# Patient Record
Sex: Male | Born: 1960 | Race: White | Hispanic: No | Marital: Married | Smoking: Never smoker
Health system: Southern US, Community
[De-identification: ages and names within clinical notes are randomized; demographics above are authoritative.]

## PROBLEM LIST (undated history)

## (undated) DIAGNOSIS — R195 Other fecal abnormalities: Secondary | ICD-10-CM

## (undated) DIAGNOSIS — K219 Gastro-esophageal reflux disease without esophagitis: Secondary | ICD-10-CM

## (undated) DIAGNOSIS — J45909 Unspecified asthma, uncomplicated: Secondary | ICD-10-CM

## (undated) DIAGNOSIS — H9193 Unspecified hearing loss, bilateral: Secondary | ICD-10-CM

## (undated) DIAGNOSIS — E7800 Pure hypercholesterolemia, unspecified: Secondary | ICD-10-CM

## (undated) DIAGNOSIS — R002 Palpitations: Secondary | ICD-10-CM

## (undated) HISTORY — DX: Other fecal abnormalities: R19.5

## (undated) HISTORY — DX: Unspecified hearing loss, bilateral: H91.93

## (undated) HISTORY — PX: HERNIA REPAIR: SHX51

## (undated) HISTORY — DX: Palpitations: R00.2

## (undated) HISTORY — DX: Gastro-esophageal reflux disease without esophagitis: K21.9

## (undated) HISTORY — DX: Pure hypercholesterolemia, unspecified: E78.00

## (undated) HISTORY — PX: COLONOSCOPY: SHX174

---

## 2018-08-19 ENCOUNTER — Ambulatory Visit (HOSPITAL_COMMUNITY)
Admission: RE | Admit: 2018-08-19 | Discharge: 2018-08-19 | Disposition: A | Discharge status: Home / Self Care | Payer: BC Managed Care – PPO | Source: Other Acute Inpatient Hospital | Attending: Orthopedic Surgery | Admitting: Orthopedic Surgery

## 2018-08-19 ENCOUNTER — Ambulatory Visit (HOSPITAL_COMMUNITY): Payer: BC Managed Care – PPO | Admitting: Certified Registered Nurse Anesthetist

## 2018-08-19 ENCOUNTER — Emergency Department (HOSPITAL_COMMUNITY): Payer: BC Managed Care – PPO

## 2018-08-19 ENCOUNTER — Encounter (HOSPITAL_COMMUNITY): Payer: Self-pay | Admitting: Obstetrics and Gynecology

## 2018-08-19 ENCOUNTER — Other Ambulatory Visit: Payer: Self-pay

## 2018-08-19 ENCOUNTER — Encounter (HOSPITAL_COMMUNITY): Payer: Self-pay

## 2018-08-19 ENCOUNTER — Emergency Department (HOSPITAL_COMMUNITY)
Admission: EM | Admit: 2018-08-19 | Discharge: 2018-08-19 | Disposition: A | Discharge status: Home / Self Care | Payer: BC Managed Care – PPO | Source: Home / Self Care | Attending: Emergency Medicine | Admitting: Emergency Medicine

## 2018-08-19 ENCOUNTER — Encounter (HOSPITAL_COMMUNITY): Admission: RE | Disposition: A | Discharge status: Home / Self Care | Payer: Self-pay | Attending: Orthopedic Surgery

## 2018-08-19 DIAGNOSIS — S61215A Laceration without foreign body of left ring finger without damage to nail, initial encounter: Secondary | ICD-10-CM | POA: Insufficient documentation

## 2018-08-19 DIAGNOSIS — Z1159 Encounter for screening for other viral diseases: Secondary | ICD-10-CM | POA: Insufficient documentation

## 2018-08-19 DIAGNOSIS — S6992XA Unspecified injury of left wrist, hand and finger(s), initial encounter: Secondary | ICD-10-CM

## 2018-08-19 DIAGNOSIS — S65515A Laceration of blood vessel of left ring finger, initial encounter: Secondary | ICD-10-CM

## 2018-08-19 DIAGNOSIS — W230XXA Caught, crushed, jammed, or pinched between moving objects, initial encounter: Secondary | ICD-10-CM | POA: Insufficient documentation

## 2018-08-19 HISTORY — PX: I & D EXTREMITY: SHX5045

## 2018-08-19 HISTORY — DX: Unspecified asthma, uncomplicated: J45.909

## 2018-08-19 LAB — SURGICAL PCR SCREEN
MRSA, PCR: NEGATIVE
Staphylococcus aureus: POSITIVE — AB

## 2018-08-19 LAB — SARS CORONAVIRUS 2 BY RT PCR (HOSPITAL ORDER, PERFORMED IN ~~LOC~~ HOSPITAL LAB): SARS Coronavirus 2: NEGATIVE

## 2018-08-19 SURGERY — IRRIGATION AND DEBRIDEMENT EXTREMITY
Anesthesia: General | Site: Hand | Laterality: Left

## 2018-08-19 MED ORDER — CEFAZOLIN SODIUM-DEXTROSE 2-4 GM/100ML-% IV SOLN
2.0000 g | INTRAVENOUS | Status: AC
  Administered 2018-08-19: 16:00:00 2 g via INTRAVENOUS
  Filled 2018-08-19: qty 100

## 2018-08-19 MED ORDER — FENTANYL CITRATE (PF) 250 MCG/5ML IJ SOLN
INTRAMUSCULAR | Status: AC
  Filled 2018-08-19: qty 5

## 2018-08-19 MED ORDER — POVIDONE-IODINE 10 % EX SWAB: 2.0000 "application " | Freq: Once | CUTANEOUS | Status: DC

## 2018-08-19 MED ORDER — MIDAZOLAM HCL 5 MG/5ML IJ SOLN
INTRAMUSCULAR | Status: DC | PRN
  Administered 2018-08-19: 2 mg via INTRAVENOUS

## 2018-08-19 MED ORDER — BUPIVACAINE HCL (PF) 0.5 % IJ SOLN
10.0000 mL | Freq: Once | INTRAMUSCULAR | Status: DC
  Filled 2018-08-19: qty 30

## 2018-08-19 MED ORDER — OXYCODONE HCL 5 MG/5ML PO SOLN: 5.0000 mg | Freq: Once | ORAL | Status: DC | PRN

## 2018-08-19 MED ORDER — BUPIVACAINE HCL (PF) 0.25 % IJ SOLN
INTRAMUSCULAR | Status: AC
  Filled 2018-08-19: qty 30

## 2018-08-19 MED ORDER — ONDANSETRON HCL 4 MG/2ML IJ SOLN
INTRAMUSCULAR | Status: DC | PRN
  Administered 2018-08-19: 4 mg via INTRAVENOUS

## 2018-08-19 MED ORDER — TETANUS-DIPHTH-ACELL PERTUSSIS 5-2.5-18.5 LF-MCG/0.5 IM SUSP
0.5000 mL | Freq: Once | INTRAMUSCULAR | Status: AC
  Administered 2018-08-19: 0.5 mL via INTRAMUSCULAR
  Filled 2018-08-19: qty 0.5

## 2018-08-19 MED ORDER — CHLORHEXIDINE GLUCONATE 4 % EX LIQD: 60.0000 mL | Freq: Once | CUTANEOUS | Status: DC

## 2018-08-19 MED ORDER — PROPOFOL 10 MG/ML IV BOLUS
INTRAVENOUS | Status: DC | PRN
  Administered 2018-08-19: 190 mg via INTRAVENOUS

## 2018-08-19 MED ORDER — FENTANYL CITRATE (PF) 100 MCG/2ML IJ SOLN: 25.0000 ug | INTRAMUSCULAR | Status: DC | PRN

## 2018-08-19 MED ORDER — MIDAZOLAM HCL 2 MG/2ML IJ SOLN
INTRAMUSCULAR | Status: AC
  Filled 2018-08-19: qty 2

## 2018-08-19 MED ORDER — LIDOCAINE HCL 1 % IJ SOLN
INTRAMUSCULAR | Status: AC
  Administered 2018-08-19: 20 mL
  Filled 2018-08-19: qty 20

## 2018-08-19 MED ORDER — OXYCODONE HCL 5 MG PO TABS: 5.0000 mg | ORAL_TABLET | Freq: Once | ORAL | Status: DC | PRN

## 2018-08-19 MED ORDER — SODIUM CHLORIDE 0.9 % IR SOLN
Status: DC | PRN
  Administered 2018-08-19: 1000 mL

## 2018-08-19 MED ORDER — PROPOFOL 10 MG/ML IV BOLUS
INTRAVENOUS | Status: AC
  Filled 2018-08-19: qty 20

## 2018-08-19 MED ORDER — FENTANYL CITRATE (PF) 100 MCG/2ML IJ SOLN
INTRAMUSCULAR | Status: DC | PRN
  Administered 2018-08-19 (×2): 50 ug via INTRAVENOUS

## 2018-08-19 MED ORDER — LACTATED RINGERS IV SOLN
INTRAVENOUS | Status: DC
  Administered 2018-08-19 (×2): via INTRAVENOUS

## 2018-08-19 MED ORDER — EPHEDRINE 5 MG/ML INJ
INTRAVENOUS | Status: AC
  Filled 2018-08-19: qty 10

## 2018-08-19 MED ORDER — EPHEDRINE SULFATE 50 MG/ML IJ SOLN
INTRAMUSCULAR | Status: DC | PRN
  Administered 2018-08-19: 5 mg via INTRAVENOUS

## 2018-08-19 MED ORDER — BUPIVACAINE HCL (PF) 0.25 % IJ SOLN: INTRAMUSCULAR | Status: DC | PRN

## 2018-08-19 MED ORDER — HYDROGEN PEROXIDE 3 % EX SOLN
CUTANEOUS | Status: AC
  Filled 2018-08-19: qty 473

## 2018-08-19 MED ORDER — CEFAZOLIN SODIUM-DEXTROSE 1-4 GM/50ML-% IV SOLN
1.0000 g | Freq: Once | INTRAVENOUS | Status: AC
  Administered 2018-08-19: 1 g via INTRAVENOUS
  Filled 2018-08-19: qty 50

## 2018-08-19 MED ORDER — ONDANSETRON HCL 4 MG/2ML IJ SOLN: 4.0000 mg | Freq: Four times a day (QID) | INTRAMUSCULAR | Status: DC | PRN

## 2018-08-19 MED ORDER — LIDOCAINE HCL (CARDIAC) PF 100 MG/5ML IV SOSY
PREFILLED_SYRINGE | INTRAVENOUS | Status: DC | PRN
  Administered 2018-08-19: 60 mg via INTRAVENOUS

## 2018-08-19 MED ORDER — DEXAMETHASONE SODIUM PHOSPHATE 4 MG/ML IJ SOLN
INTRAMUSCULAR | Status: DC | PRN
  Administered 2018-08-19: 5 mg via INTRAVENOUS

## 2018-08-19 MED ORDER — HYDROCODONE-ACETAMINOPHEN 5-325 MG PO TABS: 1.0000 | ORAL_TABLET | Freq: Four times a day (QID) | ORAL | 0 refills | Status: AC | PRN

## 2018-08-19 SURGICAL SUPPLY — 58 items
BNDG COHESIVE 1X5 TAN STRL LF (GAUZE/BANDAGES/DRESSINGS) IMPLANT
BNDG CONFORM 2 STRL LF (GAUZE/BANDAGES/DRESSINGS) ×2 IMPLANT
BNDG ELASTIC 3X5.8 VLCR STR LF (GAUZE/BANDAGES/DRESSINGS) ×3 IMPLANT
BNDG ELASTIC 4X5.8 VLCR STR LF (GAUZE/BANDAGES/DRESSINGS) ×3 IMPLANT
BNDG ESMARK 4X9 LF (GAUZE/BANDAGES/DRESSINGS) ×3 IMPLANT
BNDG GAUZE ELAST 4 BULKY (GAUZE/BANDAGES/DRESSINGS) ×1 IMPLANT
CORD BIPOLAR FORCEPS 12FT (ELECTRODE) ×3 IMPLANT
COVER SURGICAL LIGHT HANDLE (MISCELLANEOUS) ×3 IMPLANT
COVER WAND RF STERILE (DRAPES) ×1 IMPLANT
CUFF TOURN SGL QUICK 18X4 (TOURNIQUET CUFF) ×3 IMPLANT
CUFF TOURN SGL QUICK 24 (TOURNIQUET CUFF)
CUFF TRNQT CYL 24X4X16.5-23 (TOURNIQUET CUFF) IMPLANT
DECANTER SPIKE VIAL GLASS SM (MISCELLANEOUS) ×2 IMPLANT
DRAIN PENROSE 1/4X12 LTX STRL (WOUND CARE) IMPLANT
DRAPE SURG 17X23 STRL (DRAPES) ×3 IMPLANT
DRSG ADAPTIC 3X8 NADH LF (GAUZE/BANDAGES/DRESSINGS) ×3 IMPLANT
ELECT REM PT RETURN 9FT ADLT (ELECTROSURGICAL)
ELECTRODE REM PT RTRN 9FT ADLT (ELECTROSURGICAL) IMPLANT
GAUZE SPONGE 4X4 12PLY STRL (GAUZE/BANDAGES/DRESSINGS) ×3 IMPLANT
GAUZE XEROFORM 1X8 LF (GAUZE/BANDAGES/DRESSINGS) ×1 IMPLANT
GAUZE XEROFORM 5X9 LF (GAUZE/BANDAGES/DRESSINGS) IMPLANT
GLOVE BIOGEL PI IND STRL 8.5 (GLOVE) ×1 IMPLANT
GLOVE BIOGEL PI INDICATOR 8.5 (GLOVE) ×2
GLOVE SURG ORTHO 8.0 STRL STRW (GLOVE) ×3 IMPLANT
GOWN STRL REUS W/ TWL LRG LVL3 (GOWN DISPOSABLE) ×3 IMPLANT
GOWN STRL REUS W/ TWL XL LVL3 (GOWN DISPOSABLE) ×1 IMPLANT
GOWN STRL REUS W/TWL LRG LVL3 (GOWN DISPOSABLE)
GOWN STRL REUS W/TWL XL LVL3 (GOWN DISPOSABLE) ×4
HANDPIECE INTERPULSE COAX TIP (DISPOSABLE)
KIT BASIN OR (CUSTOM PROCEDURE TRAY) ×3 IMPLANT
KIT TURNOVER KIT B (KITS) ×3 IMPLANT
MANIFOLD NEPTUNE II (INSTRUMENTS) ×1 IMPLANT
NDL HYPO 25GX1X1/2 BEV (NEEDLE) IMPLANT
NEEDLE HYPO 25GX1X1/2 BEV (NEEDLE) ×3 IMPLANT
NS IRRIG 1000ML POUR BTL (IV SOLUTION) ×3 IMPLANT
PACK ORTHO EXTREMITY (CUSTOM PROCEDURE TRAY) ×3 IMPLANT
PAD ARMBOARD 7.5X6 YLW CONV (MISCELLANEOUS) ×4 IMPLANT
PAD CAST 4YDX4 CTTN HI CHSV (CAST SUPPLIES) ×1 IMPLANT
PADDING CAST COTTON 4X4 STRL (CAST SUPPLIES) ×2
SET CYSTO W/LG BORE CLAMP LF (SET/KITS/TRAYS/PACK) IMPLANT
SET HNDPC FAN SPRY TIP SCT (DISPOSABLE) IMPLANT
SOAP 2 % CHG 4 OZ (WOUND CARE) ×3 IMPLANT
SPONGE LAP 18X18 RF (DISPOSABLE) ×3 IMPLANT
SPONGE LAP 4X18 RFD (DISPOSABLE) ×1 IMPLANT
SUT ETHILON 4 0 PS 2 18 (SUTURE) IMPLANT
SUT ETHILON 5 0 P 3 18 (SUTURE)
SUT NYLON ETHILON 5-0 P-3 1X18 (SUTURE) IMPLANT
SUT PROLENE 4 0 PS 2 18 (SUTURE) ×4 IMPLANT
SWAB COLLECTION DEVICE MRSA (MISCELLANEOUS) ×1 IMPLANT
SWAB CULTURE ESWAB REG 1ML (MISCELLANEOUS) IMPLANT
SYR CONTROL 10ML LL (SYRINGE) ×2 IMPLANT
TOWEL GREEN STERILE (TOWEL DISPOSABLE) ×3 IMPLANT
TOWEL GREEN STERILE FF (TOWEL DISPOSABLE) ×3 IMPLANT
TUBE CONNECTING 12'X1/4 (SUCTIONS) ×1
TUBE CONNECTING 12X1/4 (SUCTIONS) ×2 IMPLANT
UNDERPAD 30X30 (UNDERPADS AND DIAPERS) ×3 IMPLANT
WATER STERILE IRR 1000ML POUR (IV SOLUTION) ×3 IMPLANT
YANKAUER SUCT BULB TIP NO VENT (SUCTIONS) ×3 IMPLANT

## 2018-08-19 NOTE — H&P (Addendum)
Reason for Consult:Left ring finger lac Referring Physician: A Rolinda Roananavati  Ruben Parker is an 58 y.o. male.  HPI: Ruben Parker was stepping off his truck and somehow got his left ring finger caught on a pipe rack. He ended up with a deep laceration to the base of the finger on the ventral side. He came to the ED for evaluation and hand surgery was consulted. He is RHD.  History reviewed. No pertinent past medical history.  History reviewed. No pertinent surgical history.  No family history on file.  Social History:  reports that he has never smoked. He does not have any smokeless tobacco history on file. He reports current alcohol use. He reports previous drug use.  Allergies: Not on File  Medications: I have reviewed the patient's current medications.  Results for orders placed or performed during the hospital encounter of 08/19/18 (from the past 48 hour(s))  SARS Coronavirus 2 (CEPHEID - Performed in [facility] hospital lab), Hosp Order     Status: None   Collection Time: 08/19/18  9:21 AM   Specimen: Nasopharyngeal Swab  Result Value Ref Range   SARS Coronavirus 2 NEGATIVE NEGATIVE    Comment: (NOTE) If result is NEGATIVE SARS-CoV-2 target nucleic acids are NOT DETECTED. The SARS-CoV-2 RNA is generally detectable in upper and lower  respiratory specimens during the acute phase of infection. The lowest  concentration of SARS-CoV-2 viral copies this assay can detect is 250  copies / mL. A negative result does not preclude SARS-CoV-2 infection  and should not be used as the sole basis for treatment or other  patient management decisions.  A negative result may occur with  improper specimen collection / handling, submission of specimen other  than nasopharyngeal swab, presence of viral mutation(s) within the  areas targeted by this assay, and inadequate number of viral copies  (<250 copies / mL). A negative result must be combined with clinical  observations, patient history, and  epidemiological information. If result is POSITIVE SARS-CoV-2 target nucleic acids are DETECTED. The SARS-CoV-2 RNA is generally detectable in upper and lower  respiratory specimens dur ing the acute phase of infection.  Positive  results are indicative of active infection with SARS-CoV-2.  Clinical  correlation with patient history and other diagnostic information is  necessary to determine patient infection status.  Positive results do  not rule out bacterial infection or co-infection with other viruses. If result is PRESUMPTIVE POSTIVE SARS-CoV-2 nucleic acids MAY BE PRESENT.   A presumptive positive result was obtained on the submitted specimen  and confirmed on repeat testing.  While 2019 novel coronavirus  (SARS-CoV-2) nucleic acids may be present in the submitted sample  additional confirmatory testing may be necessary for epidemiological  and / or clinical management purposes  to differentiate between  SARS-CoV-2 and other Sarbecovirus currently known to infect humans.  If clinically indicated additional testing with an alternate test  methodology [phone]([id number]) is advised. The SARS-CoV-2 RNA is generally  detectable in upper and lower respiratory sp ecimens during the acute  phase of infection. The expected result is Negative. Fact Sheet for Patients:  BoilerBrush.com.cyhttps://www.fda.gov/media/136312/download Fact Sheet for Healthcare Providers: https://pope.com/https://www.fda.gov/media/136313/download This test is not yet approved or cleared by the [place] FDA and has been authorized for detection and/or diagnosis of SARS-CoV-2 by FDA under an Emergency Use Authorization (EUA).  This EUA will remain in effect (meaning this test can be used) for the duration of the COVID-19 declaration under Section 564(b)(1) of the Act, 21 U.S.C. section  360bbb-3(b)(1), unless the authorization is terminated or revoked sooner. Performed at [facility], [place] [address], [place], [place] [address]      Dg Finger Ring Left  Result Date: 08/19/2018 CLINICAL DATA:  Ring finger crush injury. EXAM: LEFT RING FINGER 2+V COMPARISON:  None. FINDINGS: Soft tissue swelling noted within the proximal left ring finger. No fracture, subluxation or dislocation. No radiopaque foreign bodies. IMPRESSION: No acute bony abnormality or radiopaque foreign body. Electronically Signed   By: Rolm Baptise M.D.   On: 08/19/2018 10:06    Review of Systems  Constitutional: Negative for weight loss.  HENT: Negative for ear discharge, ear pain, hearing loss and tinnitus.   Eyes: Negative for blurred vision, double vision, photophobia and pain.  Respiratory: Negative for cough, sputum production and shortness of breath.   Cardiovascular: Negative for chest pain.  Gastrointestinal: Negative for abdominal pain, nausea and vomiting.  Genitourinary: Negative for dysuria, flank pain, frequency and urgency.  Musculoskeletal: Positive for joint pain (Left ring finger). Negative for back pain, falls, myalgias and neck pain.  Neurological: Negative for dizziness, tingling, sensory change, focal weakness, loss of consciousness and headaches.  Endo/Heme/Allergies: Does not bruise/bleed easily.  Psychiatric/Behavioral: Negative for depression, memory loss and substance abuse. The patient is not nervous/anxious.    Blood pressure (!) 154/93, pulse (!) 58, temperature 97.7 F (36.5 C), temperature source Oral, resp. rate 19, SpO2 100 %. Physical Exam  Constitutional: He appears well-developed and well-nourished. No distress.  HENT:  Head: Normocephalic and atraumatic.  Eyes: Conjunctivae are normal. Right eye exhibits no discharge. Left eye exhibits no discharge. No scleral icterus.  Neck: Normal range of motion.  Cardiovascular: Normal rate and regular rhythm.  Respiratory: Effort normal. No respiratory distress.  Musculoskeletal:     Comments: Left shoulder, elbow, wrist, digits- Lac to ventral base of ring finger, ~180  degrees, sensation absent (s/p digital block), cap refill ~8s, no instability, no blocks to motion  Sens  Ax/R/M/U intact  Mot   Ax/ R/ PIN/ M/ AIN/ U intact  Rad 2+  Neurological: He is alert.  Skin: Skin is warm and dry. He is not diaphoretic.  Psychiatric: He has a normal mood and affect. His behavior is normal.    Assessment/Plan: Left ring finger lac -- Given the depth of the wound and the delayed cap refill this probably warrants exploration. Will have him go to [facility] for Dr. Caralyn Guile to perform this afternoon. NPO until then.  Pt seen/examined Pt with ring avulsion injury Pt to OR for wound exploration Pt with good cap refil to ring on exam <3 sec Pt voiced understanding of plan  R/B/A DISCUSSED WITH PT IN HOSPITAL.  PT VOICED UNDERSTANDING OF PLAN CONSENT SIGNED DAY OF SURGERY PT SEEN AND EXAMINED PRIOR TO OPERATIVE PROCEDURE/DAY OF SURGERY SITE MARKED. QUESTIONS ANSWERED WILL GO HOME FOLLOWING SURGERY  WE ARE PLANNING SURGERY FOR YOUR UPPER EXTREMITY. THE RISKS AND BENEFITS OF SURGERY INCLUDE BUT NOT LIMITED TO BLEEDING INFECTION, DAMAGE TO NEARBY NERVES ARTERIES TENDONS, FAILURE OF SURGERY TO ACCOMPLISH ITS INTENDED GOALS, PERSISTENT SYMPTOMS AND NEED FOR FURTHER SURGICAL INTERVENTION. WITH THIS IN MIND WE WILL PROCEED. I HAVE DISCUSSED WITH THE PATIENT THE PRE AND POSTOPERATIVE REGIMEN AND THE DOS AND DON'TS. PT VOICED UNDERSTANDING AND INFORMED CONSENT SIGNED.   September Mormile [facility] MD  08/19/18    Lisette Abu, PA-C Orthopedic Surgery [phone] 08/19/2018, 10:36 AM

## 2018-08-19 NOTE — OR Nursing (Signed)
0. 25% Marcaine plain dispensed to field surgeon decided not to use.

## 2018-08-19 NOTE — Progress Notes (Signed)
No pertinent medical history. Per Dr. Linna Caprice no labs needed.

## 2018-08-19 NOTE — ED Provider Notes (Addendum)
[facility] DEPT Provider Note   CSN: [id number] Arrival date & time: 08/19/18  5329    History   Chief Complaint Chief Complaint  Patient presents with  . Finger Injury    HPI Ruben Parker is a 58 y.o. male presents to the ER for evaluation of left finger injury.  Patient states that he got his wedding band stuck on something and he pulled it embedding into the skin.  He arrives with the ring embedded into the skin of the palmar aspect of his left ring finger.  He has associated tingling to the distal finger, coldness and purple discoloration.  No interventions.  No alleviating factors.  He is right-hand dominant.  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  He thinks his last tetanus status was 1 year ago.  Last ate/drink at 7:30 AM today.    HPI  History reviewed. No pertinent past medical history.  There are no active problems to display for this patient.   History reviewed. No pertinent surgical history.      Home Medications    Prior to Admission medications   Not on File    Family History No family history on file.  Social History Social History   Tobacco Use  . Smoking status: Never Smoker  Substance Use Topics  . Alcohol use: Yes    Comment: Daily glass of wine  . Drug use: Not Currently     Allergies   Patient has no allergy information on record.   Review of Systems Review of Systems  Musculoskeletal: Positive for arthralgias.  Skin: Positive for wound.  Neurological: Positive for numbness.       Paresthesias   All other systems reviewed and are negative.    Physical Exam Updated Vital Signs BP (!) 154/93 (BP Location: Right Arm)   Pulse (!) 58   Temp 97.7 F (36.5 C) (Oral)   Resp 19   SpO2 100%   Physical Exam Constitutional:      Appearance: He is well-developed.  HENT:     Head: Normocephalic.     Nose: Nose normal.  Eyes:     General: Lids are normal.  Neck:    Musculoskeletal: Normal range of motion.  Cardiovascular:     Rate and Rhythm: Normal rate.  Pulmonary:     Effort: Pulmonary effort is normal. No respiratory distress.  Musculoskeletal: Normal range of motion.     Comments: No focal bony tenderness to left ring finger. Full extension, flexion at MCP, PIP, DIP against resistance   Skin:    Comments: There is a deep laceration horizontally across base of the left ring finger (palmar aspect) with subcutaneous tissue and bone vs tendon exposed. > 8 sec cap refill to finger tip. Skin is dusky   Neurological:     Mental Status: He is alert.     Comments: Decreased sensation to light touch left ring finger tip (unable to determine sharp vs dull, 2 point discrimination) prior to anesthetic however after anesthetic there was complete loss of sensation to the left ring finger tip. Strength and movements intact pre/post anesthetic   Psychiatric:        Behavior: Behavior normal.      ED Treatments / Results  Labs (all labs ordered are listed, but only abnormal results are displayed) Labs Reviewed  SARS CORONAVIRUS 2 (HOSPITAL ORDER, [facility])    EKG None  Radiology Dg Finger Ring  Left  Result Date: 08/19/2018 CLINICAL DATA:  Ring finger crush injury. EXAM: LEFT RING FINGER 2+V COMPARISON:  None. FINDINGS: Soft tissue swelling noted within the proximal left ring finger. No fracture, subluxation or dislocation. No radiopaque foreign bodies. IMPRESSION: No acute bony abnormality or radiopaque foreign body. Electronically Signed   By: Charlett NoseKevin  Dover M.D.   On: 08/19/2018 10:06    Procedures .Critical Care Performed by: Liberty HandyGibbons, Darlisa Spruiell J, PA-C Authorized by: Liberty HandyGibbons, Krystin Keeven J, PA-C   Critical care provider statement:    Critical care time (minutes):  45   Critical care was necessary to treat or prevent imminent or life-threatening deterioration of the following conditions:  Trauma (limb threatening, requiring  emergent ortho/hand evaluation in ER and emergent same day procedure/OR)   Critical care was time spent personally by me on the following activities:  Discussions with consultants, evaluation of patient's response to treatment, examination of patient, ordering and performing treatments and interventions, ordering and review of laboratory studies, ordering and review of radiographic studies, pulse oximetry, re-evaluation of patient's condition, obtaining history from patient or surrogate and review of old charts   I assumed direction of critical care for this patient from another provider in my specialty: no     (including critical care time)  Medications Ordered in ED Medications  hydrogen peroxide 3 % external solution (has no administration in time range)  bupivacaine (MARCAINE) 0.5 % injection 10 mL (has no administration in time range)  lidocaine (XYLOCAINE) 1 % (with pres) injection (20 mLs  Given by Other 08/19/18 0938)  Tdap (BOOSTRIX) injection 0.5 mL (0.5 mLs Intramuscular Given 08/19/18 0938)  ceFAZolin (ANCEF) IVPB 1 g/50 mL premix (0 g Intravenous Stopped 08/19/18 1050)     Initial Impression / Assessment and Plan / ED Course  I have reviewed the triage vital signs and the nursing notes.  Pertinent labs & imaging results that were available during my care of the patient were reviewed by me and considered in my medical decision making (see chart for details).  Clinical Course as of Aug 18 1152  Wed Aug 19, 2018  1009 IMPRESSION: No acute bony abnormality or radiopaque foreign body.    DG Finger Ring Left [CG]    Clinical Course User Index [CG] Liberty HandyGibbons, Sherita Decoste J, PA-C   Ring removed by EMT with ring cutter. Prior to local infiltration patient had tingling and decreased sensation as above.  Wound edges were anesthetized with lido 1% wo for wound cleaning, exam.   Exam raises concern for open fracture, vascular and nerve injury.   Will obtain x-ray. Laceration/finger soaking  in cleansing solution  X-ray is negative. Spoke to hand PA who will come see patient in ER. COVID test ordered and pending.  1130: Hand PA has evaluated pt in ER emergently. [facility]. Pt offered nerve block to control pain but reports having no pain, so will defer.  COVID negative.  Final Clinical Impressions(s) / ED Diagnoses   Final diagnoses:  Injury of finger of left hand, initial encounter    ED Discharge Orders    None         Jerrell MylarGibbons, Manning Luna J, PA-C 08/19/18 1154    Derwood KaplanNanavati, Ankit, MD 08/26/18 1119

## 2018-08-19 NOTE — Op Note (Signed)
PREOPERATIVE DIAGNOSIS: Left ring finger open wound, ring avulsion injury  POSTOPERATIVE DIAGNOSIS: Same  ATTENDING SURGEON: Dr. Iran Planas who scrubbed and present for the entire procedure  ASSISTANT SURGEON: None  ANESTHESIA: General via LMA  OPERATIVE PROCEDURE: #1: Left ring finger flexor sheath exploration and drainage #2: Left ring finger digital neuroplasty radial and ulnar digital nerves #3: Complex laceration repair left ring finger 4 cm  IMPLANTS: None  RADIOGRAPHIC INTERPRETATION: None  SURGICAL INDICATIONS: Patient is a right-hand-dominant gentleman who sustained the ring injury to his left ring finger.  Patient was seen and evaluated and recommended undergo wound exploration.  Risk benefits and alternatives were discussed in detail with the patient and a signed informed consent was obtained.  Risks include but not limited to bleeding infection damage nearby nerves arteries or tendons loss of motion of the wrist and digits incomplete relief of symptoms and need for further surgical intervention.  SURGICAL TECHNIQUE: Patient was palpated and found the preoperative holding area marked per marker made on left ring finger and indicate correct operative site.  Patient brought back the operating placed supine on the anesthesia table where the general anesthetic was administered.  Preoperative antibiotics were given prior any skin incision.  A well-padded tourniquet was then placed in the left brachium seal with the appropriate drape.  Left upper extremities then prepped and draped normal sterile fashion.  A timeout was called the correct site was identified the procedure then begun.  Attention then turned to the left ring finger the 4 cm laceration was then extended distally.  Skin flaps were then carefully protected to protect the venous structures.  The flexor sheath was explored.  The patient did have the laceration just at the level of the A2 pulley.  The flexor sheath the FDS and  FDP were both in continuity.  Exploration and drainage of the flexor sheath was then done thorough irrigation of the flexor sheath done.  Further inspection was then carried out.  Digital neuroplasty was then carried out the digital nerves were then carefully dissected free and these were in continuity.  Digital neuroplasty was then done carefully dissecting out both the arteries and the nerves.  The arteries were in continuity as well.  There was the stretching injury to both arteries and nerves.  Following exploration the wound was irrigated.  The complex laceration was then closed using Prolene suture.  Adaptic dressing a sterile compressive bandage then applied.  The fingertip was well perfused after the tourniquet deflated.  The patient is in extubated taken recovery in good condition.  POSTOPERATIVE PLAN: Patient be discharged to home.  See him back in the office in 10 to 14 days for wound check likely suture removal begin an outpatient therapy regimen to work on his mobility use and function.

## 2018-08-19 NOTE — ED Notes (Signed)
Patient had a gold ring stuck on the injured finger that had caused and open wound area. This RN used the manual ring cutter to remove the ring.

## 2018-08-19 NOTE — Consult Note (Deleted)
  The note originally documented on this encounter has been moved the the encounter in which it belongs.  

## 2018-08-19 NOTE — Anesthesia Procedure Notes (Signed)
Procedure Name: Intubation Date/Time: 08/19/2018 4:11 PM Performed by: Oletta Lamas, CRNA Pre-anesthesia Checklist: Patient identified, Emergency Drugs available, Suction available and Patient being monitored Patient Re-evaluated:Patient Re-evaluated prior to induction Oxygen Delivery Method: Circle System Utilized Preoxygenation: Pre-oxygenation with 100% oxygen Induction Type: IV induction Ventilation: Mask ventilation without difficulty LMA: LMA inserted LMA Size: 4.0 Tube type: Oral Number of attempts: 1 Placement Confirmation: ETT inserted through vocal cords under direct vision,  positive ETCO2 and breath sounds checked- equal and bilateral Tube secured with: Tape Dental Injury: Teeth and Oropharynx as per pre-operative assessment

## 2018-08-19 NOTE — ED Triage Notes (Signed)
Pt reports he got his finger caught in his truck and the finger got caught and the ring cut into it.

## 2018-08-19 NOTE — Discharge Instructions (Signed)
Go to [facility] admitting area

## 2018-08-19 NOTE — Transfer of Care (Signed)
Immediate Anesthesia Transfer of Care Note  Patient: Ruben Parker  Procedure(s) Performed: IRRIGATION AND DEBRIDEMENT RING FINGER (Left Hand)  Patient Location: PACU  Anesthesia Type:General  Level of Consciousness: awake, alert , oriented and patient cooperative  Airway & Oxygen Therapy: Patient Spontanous Breathing  Post-op Assessment: Report given to RN and Post -op Vital signs reviewed and stable  Post vital signs: Reviewed and stable  Last Vitals:  Vitals Value Taken Time  BP 105/87 08/19/18 1658  Temp    Pulse 81 08/19/18 1702  Resp 18 08/19/18 1702  SpO2 97 % 08/19/18 1702  Vitals shown include unvalidated device data.  Last Pain:  Vitals:   08/19/18 1305  TempSrc:   PainSc: 0-No pain         Complications: No apparent anesthesia complications

## 2018-08-19 NOTE — Anesthesia Preprocedure Evaluation (Signed)
Anesthesia Evaluation  Patient identified by MRN, date of birth, ID band Patient awake    Reviewed: Allergy & Precautions, H&P , NPO status , Patient's Chart, lab work & pertinent test results  Airway Mallampati: II   Neck ROM: full    Dental   Pulmonary asthma ,    breath sounds clear to auscultation       Cardiovascular negative cardio ROS   Rhythm:regular Rate:Normal     Neuro/Psych    GI/Hepatic   Endo/Other    Renal/GU      Musculoskeletal   Abdominal   Peds  Hematology   Anesthesia Other Findings   Reproductive/Obstetrics                             Anesthesia Physical Anesthesia Plan  ASA: II  Anesthesia Plan: General   Post-op Pain Management:    Induction: Intravenous  PONV Risk Score and Plan: 2 and Ondansetron, Dexamethasone, Midazolam and Treatment may vary due to age or medical condition  Airway Management Planned: LMA  Additional Equipment:   Intra-op Plan:   Post-operative Plan:   Informed Consent: I have reviewed the patients History and Physical, chart, labs and discussed the procedure including the risks, benefits and alternatives for the proposed anesthesia with the patient or authorized representative who has indicated his/her understanding and acceptance.       Plan Discussed with: CRNA, Anesthesiologist and Surgeon  Anesthesia Plan Comments:         Anesthesia Quick Evaluation

## 2018-08-19 NOTE — Discharge Instructions (Signed)
KEEP BANDAGE CLEAN AND DRY CALL OFFICE FOR F/U APPT [phone] in 10 days KEEP HAND ELEVATED ABOVE HEART OK TO APPLY ICE TO OPERATIVE AREA CONTACT OFFICE IF ANY WORSENING PAIN OR CONCERNS.  

## 2018-08-20 ENCOUNTER — Encounter (HOSPITAL_COMMUNITY): Payer: Self-pay | Admitting: Orthopedic Surgery

## 2018-08-20 NOTE — Anesthesia Postprocedure Evaluation (Signed)
Anesthesia Post Note  Patient: Ruben Parker  Procedure(s) Performed: IRRIGATION AND DEBRIDEMENT RING FINGER (Left Hand)     Patient location during evaluation: PACU Anesthesia Type: General Level of consciousness: awake and alert Pain management: pain level controlled Vital Signs Assessment: post-procedure vital signs reviewed and stable Respiratory status: spontaneous breathing, nonlabored ventilation, respiratory function stable and patient connected to nasal cannula oxygen Cardiovascular status: blood pressure returned to baseline and stable Postop Assessment: no apparent nausea or vomiting Anesthetic complications: no    Last Vitals:  Vitals:   08/19/18 1700 08/19/18 1727  BP:  (!) 143/83  Pulse:  62  Resp:  17  Temp: 37 C   SpO2:  97%    Last Pain:  Vitals:   08/19/18 1738  TempSrc:   PainSc: 0-No pain                 Kiandre Spagnolo S

## 2019-04-22 ENCOUNTER — Ambulatory Visit: Payer: BC Managed Care – PPO | Attending: Internal Medicine

## 2019-04-22 DIAGNOSIS — Z23 Encounter for immunization: Secondary | ICD-10-CM

## 2019-04-22 NOTE — Progress Notes (Signed)
   Covid-19 Vaccination Clinic  Name:  Ruben Parker    MRN: [id number] DOB: 04/16/1960  04/22/2019  [name] was observed post Covid-19 immunization for 15 minutes without incident. He was provided with Vaccine Information Sheet and instruction to access the V-Safe system.   [name] was instructed to call 911 with any severe reactions post vaccine: Marland Kitchen Difficulty breathing  . Swelling of face and throat  . A fast heartbeat  . A bad rash all over body  . Dizziness and weakness   Immunizations Administered    Name Date Dose VIS Date Route   Pfizer COVID-19 Vaccine 04/22/2019  4:15 PM 0.3 mL 01/08/2019 Intramuscular   Manufacturer: [employer], [employer]   Lot: [id number]   NDC: [address]-6

## 2019-05-17 ENCOUNTER — Ambulatory Visit: Payer: BC Managed Care – PPO | Attending: Internal Medicine

## 2019-05-17 DIAGNOSIS — Z23 Encounter for immunization: Secondary | ICD-10-CM

## 2019-05-17 NOTE — Progress Notes (Signed)
   Covid-19 Vaccination Clinic  Name:  Ruben Parker    MRN: [id number] DOB: Nov 08, 1960  05/17/2019  Mr. Ruben Parker was observed post Covid-19 immunization for 15 minutes without incident. He was provided with Vaccine Information Sheet and instruction to access the V-Safe system.   Mr. Ruben Parker was instructed to call 911 with any severe reactions post vaccine: Marland Kitchen Difficulty breathing  . Swelling of face and throat  . A fast heartbeat  . A bad rash all over body  . Dizziness and weakness   Immunizations Administered    Name Date Dose VIS Date Route   Pfizer COVID-19 Vaccine 05/17/2019  4:14 PM 0.3 mL 03/24/2018 Intramuscular   Manufacturer: [employer], [employer]   Lot: [id number]   NDC: [address]-1

## 2020-09-29 IMAGING — CR LEFT RING FINGER 2+V
3 series · 3 of 3 positions shown · non-contrast
Comparison: None.

CLINICAL DATA: Ring finger crush injury.

EXAM:
LEFT RING FINGER 2+V

[x finger pa left]
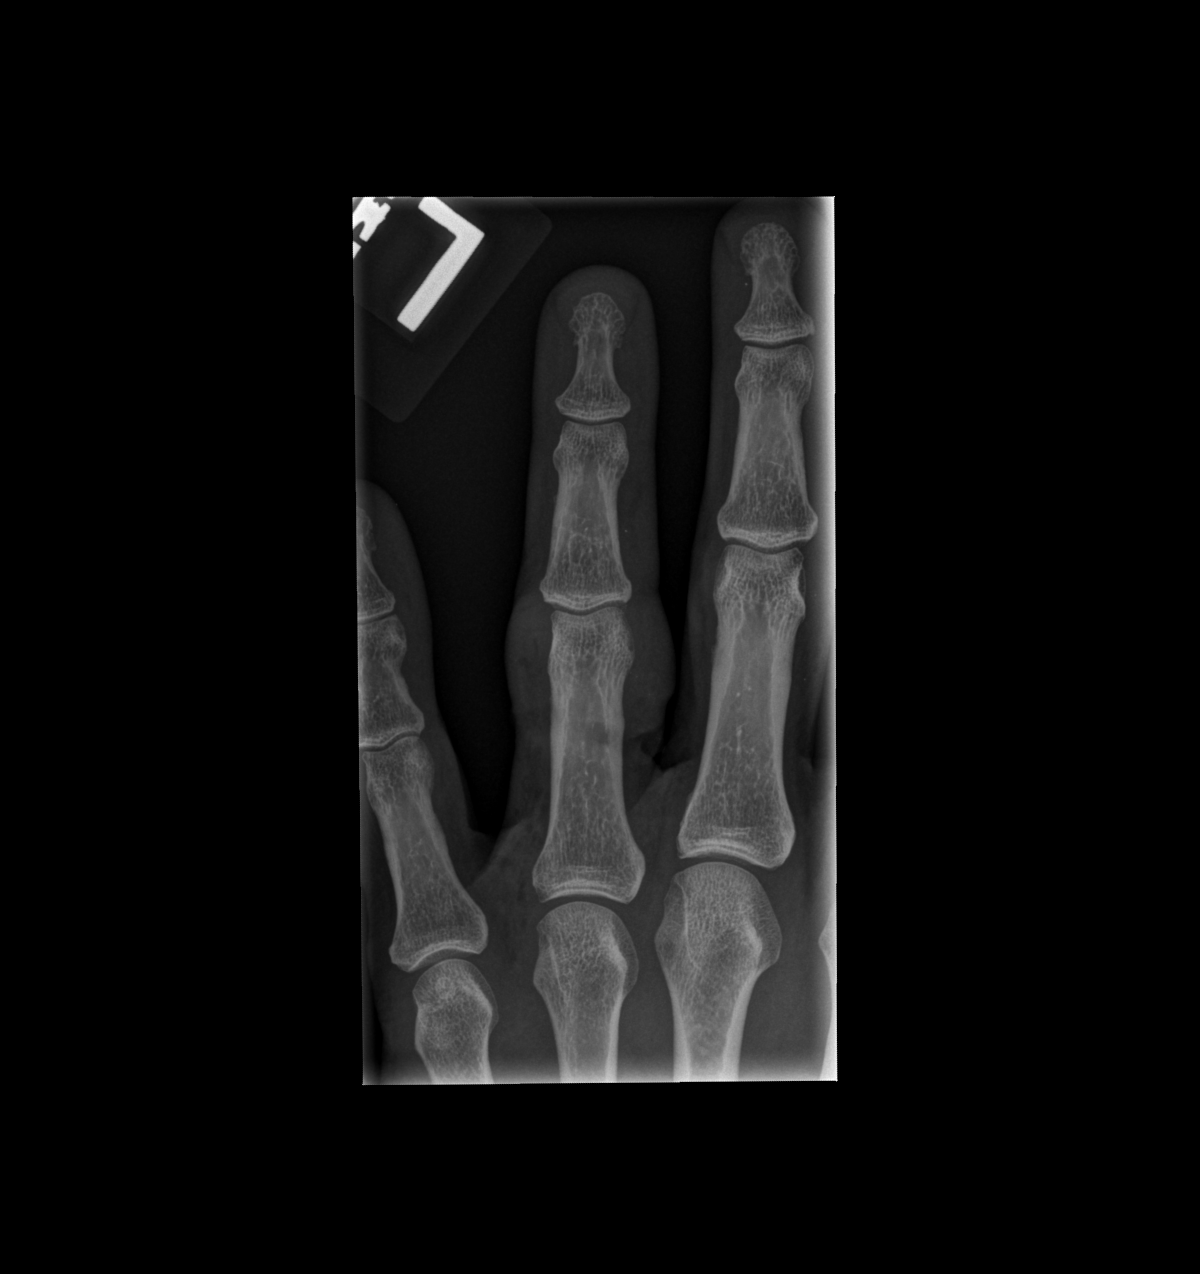

[x finger obl left]
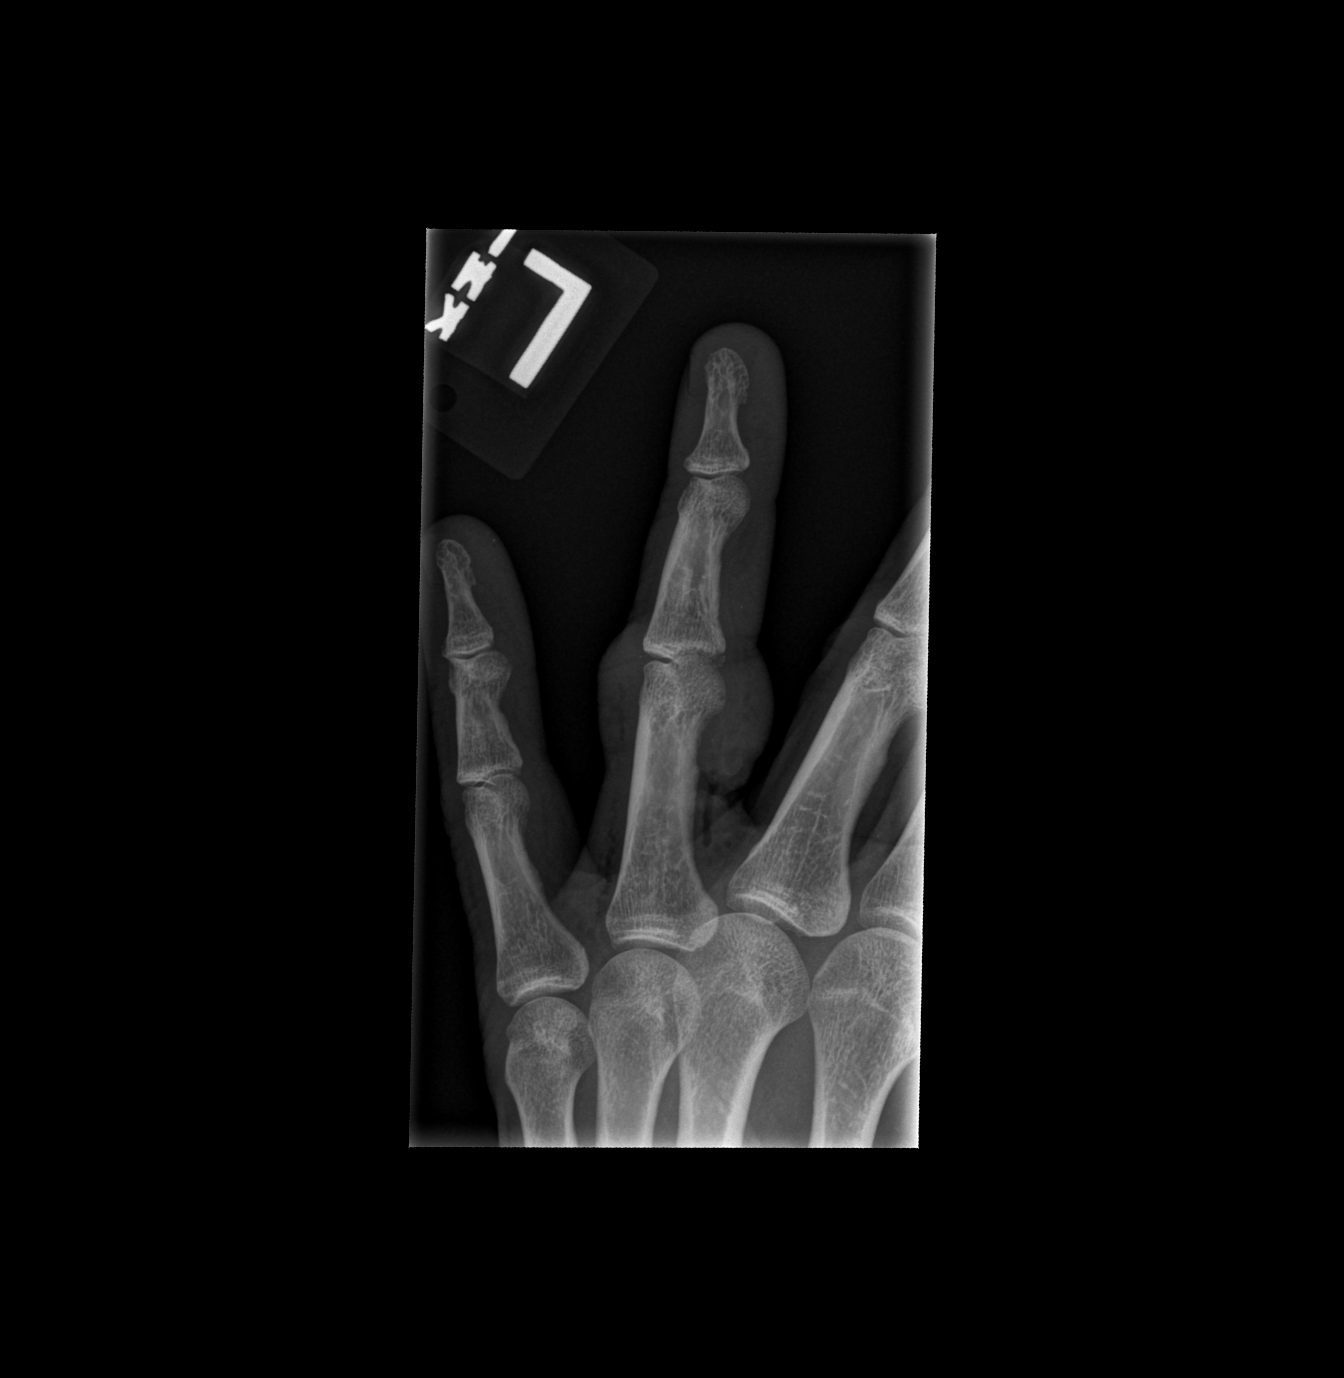

[x finger lat left]
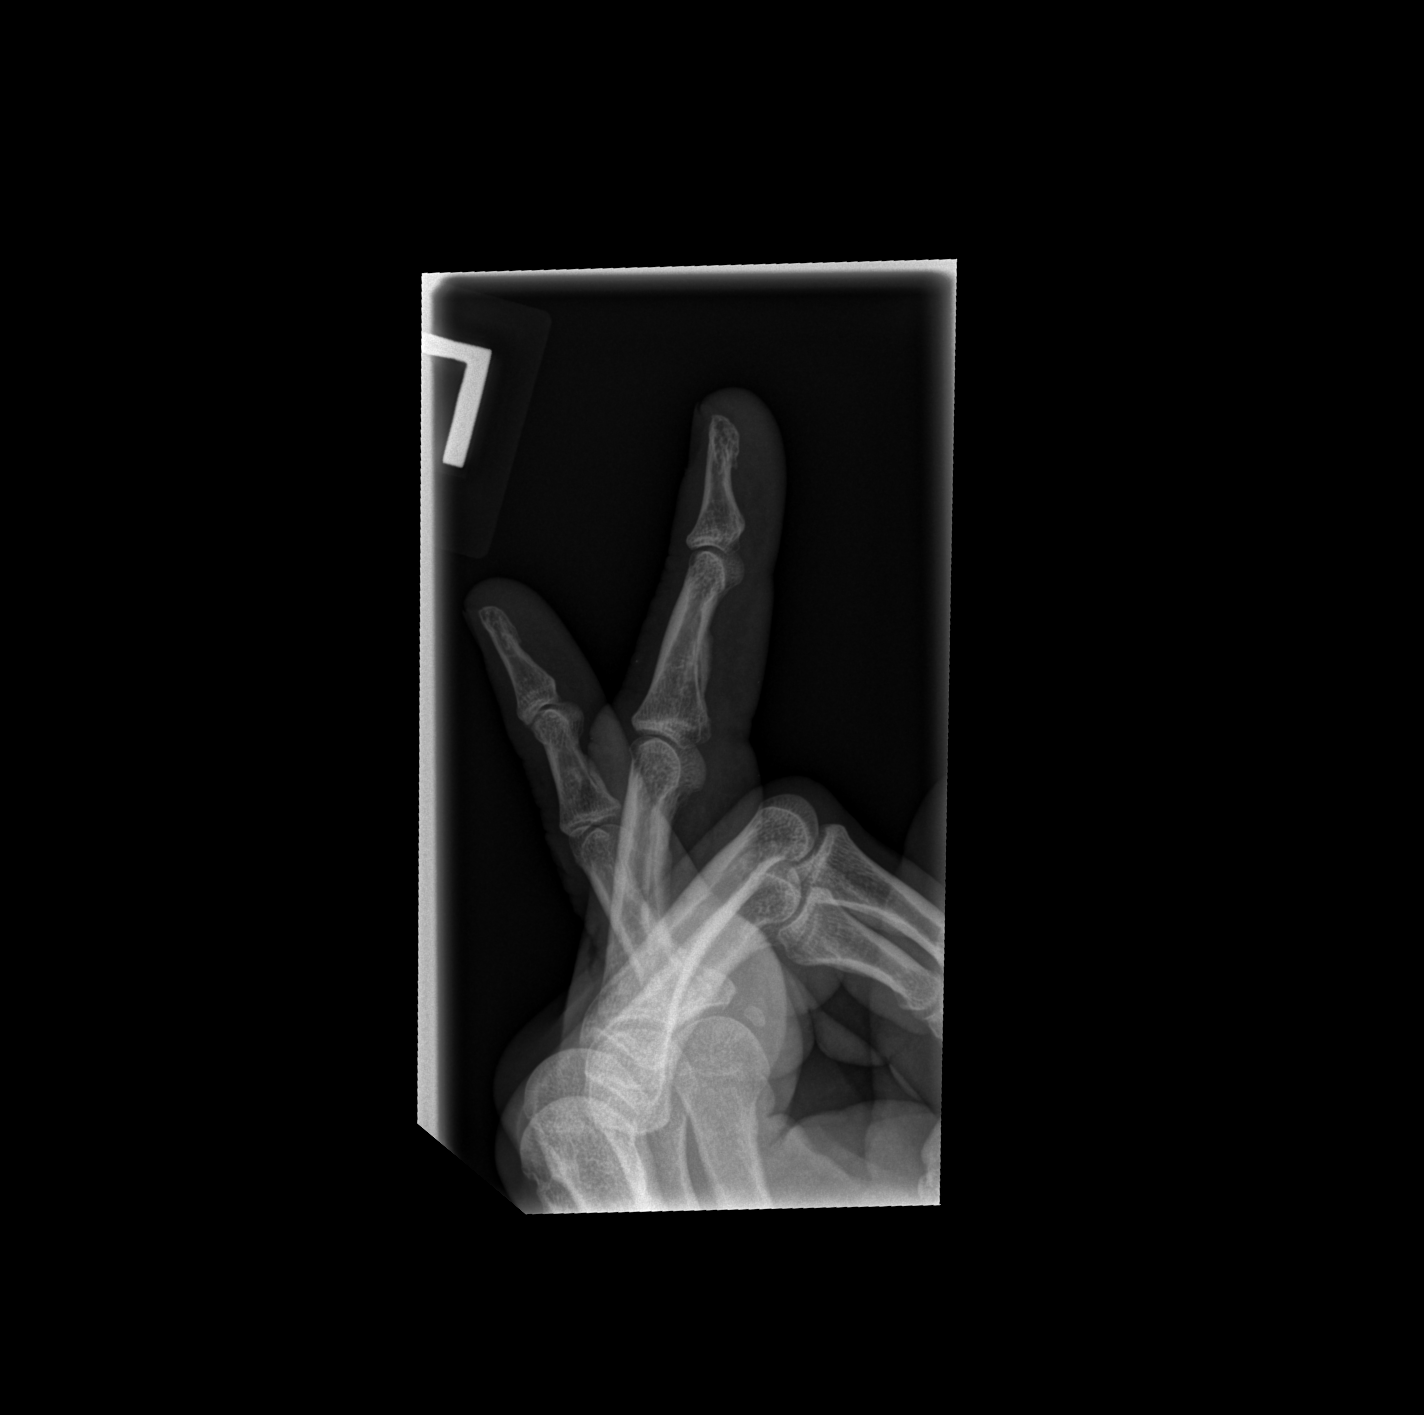

[3 of 3 positions shown; findings below may reference images not displayed]

FINDINGS: Soft tissue swelling noted within the proximal left ring finger. No
fracture, subluxation or dislocation. No radiopaque foreign bodies.
IMPRESSION: No acute bony abnormality or radiopaque foreign body.

## 2021-08-03 NOTE — Progress Notes (Unsigned)
Cardiology Office Note:    Date:  08/03/2021   ID:  Ruben Parker, DOB Sep 12, 1960, MRN [id number]  PCP:  Tally Joe, MD   [facility] Providers Cardiologist:  Alverda Skeans, MD Referring MD: Murlean Hark*   Chief Complaint/Reason for Referral: Palpitations  ASSESSMENT:    1. Palpitations     PLAN:    In order of problems listed above: 1.  Palpitations: We will check reflex TSH, CMP, CBC, echocardiogram, and monitor.  We will keep follow-up open-ended depending on the results of these tests.         {Are you ordering a CV Procedure (e.g. stress test, cath, DCCV, TEE, etc)?   Press F2        :[id number]}   Dispo:  No follow-ups on file.      Medication Adjustments/Labs and Tests Ordered: Current medicines are reviewed at length with the patient today.  Concerns regarding medicines are outlined above.  The following changes have been made:  {PLAN; NO CHANGE:13088:s}   Labs/tests ordered: No orders of the defined types were placed in this encounter.   Medication Changes: No orders of the defined types were placed in this encounter.    Current medicines are reviewed at length with the patient today.  The patient {ACTIONS; HAS/DOES NOT HAVE:19233} concerns regarding medicines.   History of Present Illness:    FOCUSED PROBLEM LIST:   1.  GERD 2.  Hyperlipidemia  The patient is a 61 y.o. male with the indicated medical history here for palpitations.  Patient was seen by his primary care provider recently complaining of palpitations.          Current Medications: No outpatient medications have been marked as taking for the 08/06/21 encounter (Appointment) with Orbie Pyo, MD.     Allergies:    Patient has no known allergies.   Social History:   Social History   Tobacco Use   Smoking status: Never   Smokeless tobacco: Never  Substance Use Topics   Alcohol use: Yes    Alcohol/week: 2.0 - 3.0 standard drinks of alcohol     Types: 2 - 3 Glasses of wine per week    Comment: Daily glass of wine   Drug use: Not Currently     Family Hx: No family history on file.   Review of Systems:   Please see the history of present illness.    All other systems reviewed and are negative.     EKGs/Labs/Other Test Reviewed:    EKG:  EKG performed today that I personally reviewed demonstrates ***.  Prior CV studies: None available    Other studies Reviewed: Review of the additional studies/records demonstrates: None relevant  Recent Labs: No results found for requested labs within last 365 days.   Recent Lipid Panel No results found for: "CHOL", "TRIG", "HDL", "LDLCALC", "LDLDIRECT"  Risk Assessment/Calculations:    {Does this patient have ATRIAL FIBRILLATION?:[phone]}      Physical Exam:    VS:  There were no vitals taken for this visit.   Wt Readings from Last 3 Encounters:  08/19/18 158 lb (71.7 kg)    GENERAL:  No apparent distress, AOx3 HEENT:  No carotid bruits, +2 carotid impulses, no scleral icterus CAR: RRR Irregular RR*** no murmurs***, gallops, rubs, or thrills RES:  Clear to auscultation bilaterally ABD:  Soft, nontender, nondistended, positive bowel sounds x 4 VASC:  +2 radial pulses, +2 carotid pulses, palpable pedal pulses NEURO:  CN 2-12 grossly intact; motor and  sensory grossly intact PSYCH:  No active depression or anxiety EXT:  No edema, ecchymosis, or cyanosis  Signed, Orbie Pyo, MD  08/03/2021 1:12 PM    [facility] [address] [place], [place], [place]  [address] Phone: [phone]; Fax: [phone]   Note:  This document was prepared using Dragon voice recognition software and may include unintentional dictation errors.

## 2021-08-06 ENCOUNTER — Ambulatory Visit: Payer: BC Managed Care – PPO | Admitting: Internal Medicine

## 2021-08-06 ENCOUNTER — Ambulatory Visit (INDEPENDENT_AMBULATORY_CARE_PROVIDER_SITE_OTHER): Payer: BC Managed Care – PPO

## 2021-08-06 ENCOUNTER — Telehealth: Payer: Self-pay | Admitting: *Deleted

## 2021-08-06 ENCOUNTER — Encounter: Payer: Self-pay | Admitting: Internal Medicine

## 2021-08-06 VITALS — BP 168/92 | HR 70 | Ht 64.0 in | Wt 166.0 lb

## 2021-08-06 DIAGNOSIS — R002 Palpitations: Secondary | ICD-10-CM

## 2021-08-06 DIAGNOSIS — R030 Elevated blood-pressure reading, without diagnosis of hypertension: Secondary | ICD-10-CM | POA: Diagnosis not present

## 2021-08-06 DIAGNOSIS — R0683 Snoring: Secondary | ICD-10-CM

## 2021-08-06 NOTE — Patient Instructions (Addendum)
Medication Instructions:  Your physician has recommended you make the following change in your medication:  1) STOP Aspirin  *If you need a refill on your cardiac medications before your next appointment, please call your pharmacy*  Lab Work: NONE  Testing/Procedures: Your physician has recommended you wear a Zio heart monitor for 3 days.  Your physician has requested that you have an echocardiogram. Echocardiography is a painless test that uses sound waves to create images of your heart. It provides your doctor with information about the size and shape of your heart and how well your heart's chambers and valves are working. This procedure takes approximately one hour. There are no restrictions for this procedure.  Your physician has recommended you have a home sleep study performed. [facility] scheduler will call you to set this up.  Follow-Up: At [facility], you and your health needs are our priority.  As part of our continuing mission to provide you with exceptional heart care, we have created designated Provider Care Teams.  These Care Teams include your primary Cardiologist (physician) and Advanced Practice Providers (APPs -  Physician Assistants and Nurse Practitioners) who all work together to provide you with the care you need, when you need it.  Your next appointment:   As needed  The format for your next appointment:   In Person  Provider:   Orbie Pyo, MD {  Other Instructions Christena Deem- Long Term Monitor Instructions  Your physician has requested you wear a ZIO patch monitor for 3 days.  This is a single patch monitor. [employer] supplies one patch monitor per enrollment. Additional stickers are not available. Please do not apply patch if you will be having a Nuclear Stress Test,  Echocardiogram, Cardiac CT, MRI, or Chest Xray during the period you would be wearing the  monitor. The patch cannot be worn during these tests. You cannot remove  and re-apply the  ZIO XT patch monitor.  Your ZIO patch monitor will be mailed 3 day USPS to your address on file. It may take 3-5 days  to receive your monitor after you have been enrolled.  Once you have received your monitor, please review the enclosed instructions. Your monitor  has already been registered assigning a specific monitor serial # to you.  Billing and Patient Assistance Program Information  We have supplied [employer] with any of your insurance information on file for billing purposes. [employer] offers a sliding scale Patient Assistance Program for patients that do not have  insurance, or whose insurance does not completely cover the cost of the ZIO monitor.  You must apply for the Patient Assistance Program to qualify for this discounted rate.  To apply, please call [employer] at [phone], select option 4, select option 2, ask to apply for  Patient Assistance Program. Meredeth Ide will ask your household income, and how many people  are in your household. They will quote your out-of-pocket cost based on that information.  [employer] will also be able to set up a 65-month, interest-free payment plan if needed.  Applying the monitor   Shave hair from upper left chest.  Hold abrader disc by orange tab. Rub abrader in 40 strokes over the upper left chest as  indicated in your monitor instructions.  Clean area with 4 enclosed alcohol pads. Let dry.  Apply patch as indicated in monitor instructions. Patch will be placed under collarbone on left  side of chest with arrow pointing upward.  Rub patch adhesive wings for  2 minutes. Remove white label marked "1". Remove the white  label marked "2". Rub patch adhesive wings for 2 additional minutes.  While looking in a mirror, press and release button in center of patch. A small green light will  flash 3-4 times. This will be your only indicator that the monitor has been turned on.  Do not shower for the first 24 hours. You may shower after  the first 24 hours.  Press the button if you feel a symptom. You will hear a small click. Record Date, Time and  Symptom in the Patient Logbook.  When you are ready to remove the patch, follow instructions on the last 2 pages of Patient  Logbook. Stick patch monitor onto the last page of Patient Logbook.  Place Patient Logbook in the blue and white box. Use locking tab on box and tape box closed  securely. The blue and white box has prepaid postage on it. Please place it in the mailbox as  soon as possible. Your physician should have your test results approximately 7 days after the  monitor has been mailed back to [facility].  Call [facility] Customer Care at [phone] if you have questions regarding  your ZIO XT patch monitor. Call them immediately if you see an orange light blinking on your  monitor.  If your monitor falls off in less than 4 days, contact our Monitor department at [phone].  If your monitor becomes loose or falls off after 4 days call [employer] at [phone] for  suggestions on securing your monitor   Important Information About [employer]

## 2021-08-06 NOTE — Progress Notes (Unsigned)
Enrolled for [employer] to mail a ZIO XT long term holter monitor to the patients address on file.  

## 2021-08-06 NOTE — Telephone Encounter (Signed)
Prior Authorization for titration sent to [employer] via web portal. do not require Pre-Authorization by [employer].

## 2021-08-10 DIAGNOSIS — R002 Palpitations: Secondary | ICD-10-CM

## 2021-08-20 ENCOUNTER — Ambulatory Visit (HOSPITAL_COMMUNITY): Payer: BC Managed Care – PPO | Attending: Cardiology

## 2021-08-20 DIAGNOSIS — I340 Nonrheumatic mitral (valve) insufficiency: Secondary | ICD-10-CM

## 2021-08-20 DIAGNOSIS — R002 Palpitations: Secondary | ICD-10-CM

## 2021-08-20 LAB — ECHOCARDIOGRAM COMPLETE
Area-P 1/2: 3.28 cm2
MV M vel: 5.28 m/s
MV Peak grad: 111.3 mmHg
S' Lateral: 2.6 cm

## 2021-08-28 ENCOUNTER — Ambulatory Visit (HOSPITAL_BASED_OUTPATIENT_CLINIC_OR_DEPARTMENT_OTHER): Payer: BC Managed Care – PPO | Attending: Internal Medicine | Admitting: Cardiovascular Disease

## 2021-08-28 DIAGNOSIS — G4733 Obstructive sleep apnea (adult) (pediatric): Secondary | ICD-10-CM | POA: Diagnosis not present

## 2021-08-28 DIAGNOSIS — R0602 Shortness of breath: Secondary | ICD-10-CM | POA: Insufficient documentation

## 2021-08-28 DIAGNOSIS — G4736 Sleep related hypoventilation in conditions classified elsewhere: Secondary | ICD-10-CM | POA: Diagnosis not present

## 2021-08-28 DIAGNOSIS — R0683 Snoring: Secondary | ICD-10-CM

## 2021-08-31 ENCOUNTER — Encounter (HOSPITAL_BASED_OUTPATIENT_CLINIC_OR_DEPARTMENT_OTHER): Payer: BC Managed Care – PPO | Admitting: Internal Medicine

## 2021-09-25 ENCOUNTER — Encounter (HOSPITAL_BASED_OUTPATIENT_CLINIC_OR_DEPARTMENT_OTHER): Payer: Self-pay | Admitting: Cardiovascular Disease

## 2021-09-25 ENCOUNTER — Other Ambulatory Visit: Payer: Self-pay | Admitting: Cardiovascular Disease

## 2021-09-25 ENCOUNTER — Telehealth: Payer: Self-pay | Admitting: *Deleted

## 2021-09-25 DIAGNOSIS — G4733 Obstructive sleep apnea (adult) (pediatric): Secondary | ICD-10-CM

## 2021-09-25 DIAGNOSIS — G4736 Sleep related hypoventilation in conditions classified elsewhere: Secondary | ICD-10-CM

## 2021-09-25 NOTE — Telephone Encounter (Signed)
Patient notified of sleep study results and recommendations. He agrees to proceed with  CPAP titration. Per [employer] portal no PA is required.

## 2021-09-25 NOTE — Procedures (Signed)
      Patient Name: Ruben, Parker Date: 08/28/2021 Gender: Male D.O.B: 1960-10-04 Age (years): 60 Referring Provider: Alverda Skeans Height (inches): 64 Interpreting Physician: Nicki Guadalajara MD, ABSM Weight (lbs): 165 RPSGT: [place] BMI: 28 MRN: [id number] Neck Size: 16.00  CLINICAL INFORMATION Sleep Study Type: HST  Indication for sleep study: snoring, daytime sleepiness, palpitations  Epworth Sleepiness Score: 10  SLEEP STUDY TECHNIQUE A multi-channel overnight portable sleep study was performed. The channels recorded were: nasal airflow, thoracic respiratory movement, and oxygen saturation with a pulse oximetry. Snoring was also monitored.  MEDICATIONS Patient self administered medications include: N/A.  SLEEP ARCHITECTURE Patient was studied for 364.5 minutes. The sleep efficiency was 100.0 % and the patient was supine for 0%. The arousal index was 0.0 per hour.  RESPIRATORY PARAMETERS The overall AHI was 62.6 per hour, with a central apnea index of 0 per hour.  The oxygen nadir was 81% during sleep.  CARDIAC DATA Mean heart rate during sleep was 59.5 bpm. Heart rate range was 47 - 86 bpm.  IMPRESSIONS - Severe obstructive sleep apnea occurred during this study (AHI  62.6/h). - Moderate oxygen desaturation  to a nadir of 81%. - Patient snored for 161.8 minutes (44.4%) during the sleep.  DIAGNOSIS - Obstructive Sleep Apnea (G47.33) - Nocturnal Hypoxemia (G47.36)  RECOMMENDATIONS - In this patient with severe sleep disordered breathing recommend an in-lab  CPAP titration study for optimal evaluation and treatment. If unable to obtain an in-lab titration, initiate Auto-PAP with EPR of 3 at 7 - 20 cm of water. - Effort shoiuld be made to optimize nasal and oropharyngeal patency. - Positional therapy avoiding supine position during sleep. - Avoid alcohol, sedatives and other CNS depressants that may worsen sleep apnea and disrupt normal sleep  architecture. - Sleep hygiene should be reviewed to assess factors that may improve sleep quality. - Weight management and regular exercise should be initiated or continued. - Recommend a download and sleep clinic evaluation after 4 weeks of therapy.    [Electronically signed] 09/25/2021 10:52 AM  Nicki Guadalajara MD, [facility] ABSM Diplomate, American Board of Sleep Medicine  NPI: [id number]  [facility] PH: [phone]   FX: [phone] ACCREDITED BY THE AMERICAN ACADEMY OF SLEEP MEDICINE

## 2021-09-25 NOTE — Telephone Encounter (Signed)
-----   Message from Lennette Bihari, MD sent at 09/25/2021 10:56 AM EDT ----- Ruben Parker, please notify patient of the results of the sleep study which revealed severe sleep apnea.  Due to severity, try for in lab CPAP titration, if unable, then initiate AutoPap as prescribed.

## 2021-10-08 ENCOUNTER — Telehealth: Payer: Self-pay | Admitting: Internal Medicine

## 2021-10-08 NOTE — Telephone Encounter (Signed)
Patient calling in to see if the sleep study is needed because he breathing had gotten better. But he states if the dr truly think he needs it, he will do it. Please advise

## 2021-10-11 ENCOUNTER — Ambulatory Visit (HOSPITAL_BASED_OUTPATIENT_CLINIC_OR_DEPARTMENT_OTHER): Payer: BC Managed Care – PPO | Attending: Cardiovascular Disease | Admitting: Cardiovascular Disease

## 2021-10-11 DIAGNOSIS — G4733 Obstructive sleep apnea (adult) (pediatric): Secondary | ICD-10-CM | POA: Insufficient documentation

## 2021-10-11 DIAGNOSIS — G4736 Sleep related hypoventilation in conditions classified elsewhere: Secondary | ICD-10-CM | POA: Diagnosis not present

## 2021-10-11 NOTE — Telephone Encounter (Signed)
Patient is calling about his sleep study he is having done today.  He would like to speak to somebody about it before he goes to it tonight.

## 2021-10-11 NOTE — Telephone Encounter (Signed)
Returned a call to th patient. He was questioning if he should proceed with CPAP titration sleep study scheduled for tonight. He seemed to have some anxiety about doing the study and wanted to know if there are any alternatives to CPAP. I told the patient this is usually the first line of treatment. His diagnosis is "severe sleep apnea" so I doubt that he will qualify for anything else. Patient also informed that if there is a another alternative  the doctor will put it in the report. Patient voiced understanding and will keep his CPAP titration appointment.

## 2021-10-15 ENCOUNTER — Encounter (HOSPITAL_BASED_OUTPATIENT_CLINIC_OR_DEPARTMENT_OTHER): Payer: Self-pay | Admitting: Cardiovascular Disease

## 2021-10-15 NOTE — Procedures (Signed)
      Patient Name: Ruben Parker, Ruben Parker Date: 10/11/2021 Gender: Male D.O.B: 1960-05-29 Age (years): 64 Referring Provider: Lenna Sciara Height (inches): 64 Interpreting Physician: Shelva Majestic MD, ABSM Weight (lbs): 160 RPSGT: [place] BMI: 27 MRN: [id number] Neck Size: 16.00  CLINICAL INFORMATION The patient is referred for a CPAP titration to treat sleep apnea.  Date of HST: 08/28/2021: AHI 62.6/h; O2 nadir 81%, significant snoring  SLEEP STUDY TECHNIQUE As per the AASM Manual for the Scoring of Sleep and Associated Events v2.3 (April 2016) with a hypopnea requiring 4% desaturations.  The channels recorded and monitored were frontal, central and occipital EEG, electrooculogram (EOG), submentalis EMG (chin), nasal and oral airflow, thoracic and abdominal wall motion, anterior tibialis EMG, snore microphone, electrocardiogram, and pulse oximetry. Continuous positive airway pressure (CPAP) was initiated at the beginning of the study and titrated to treat sleep-disordered breathing.  MEDICATIONS None Medications self-administered by patient taken the night of the study : N/A  TECHNICIAN COMMENTS Comments added by technician: Patient was restless all through the night. Patient had difficulty initiating sleep. Comments added by scorer: N/A  RESPIRATORY PARAMETERS Optimal PAP Pressure (cm):  AHI at Optimal Pressure (/hr): N/A Overall Minimal O2 (%): 88.0 Supine % at Optimal Pressure (%): N/A Minimal O2 at Optimal Pressure (%): 88.0   SLEEP ARCHITECTURE The study was initiated at 11:16:21 PM and ended at 5:23:33 AM.  Sleep onset time was 11.4 minutes and the sleep efficiency was 74.1%%. The total sleep time was 272 minutes.  The patient spent 2.6%% of the night in stage N1 sleep, 87.3%% in stage N2 sleep, 0.0%% in stage N3 and 10.1% in REM.Stage REM latency was 73.5 minutes  Wake after sleep onset was 83.8. Alpha intrusion was absent. Supine sleep was  46.32%.  CARDIAC DATA The 2 lead EKG demonstrated sinus rhythm. The mean heart rate was 92.7 beats per minute. Other EKG findings include: None.  LEG MOVEMENT DATA The total Periodic Limb Movements of Sleep (PLMS) were 0. The PLMS index was 0.0. A PLMS index of <15 is considered normal in adults.  IMPRESSIONS - CPAP was initiated at 6 cm and was only titrated to 10 cm of water (AHI 9.3/h, RDI 18.7/h without REM sleep at 10 cm). - Mild oxygen desaturation to a nadir 88.0%. - The patient snored with moderate snoring volume during this titration study. - No cardiac abnormalities were observed during this study. - Clinically significant periodic limb movements were not noted during this study. Arousals associated with PLMs were rare.  DIAGNOSIS - Obstructive Sleep Apnea (G47.33)  RECOMMENDATIONS - With suboptimal CPAP titration recommend an initial trial of CPAP Auto with EPR of 3 at 10 - 20 cm of water. BiPAP therapy may be necessary if CPAP at higher pressures remains suboptimal.  - Effort should be made to optimize nasal and oropharngeal patency. - Avoid alcohol, sedatives and other CNS depressants that may worsen sleep apnea and disrupt normal sleep architecture. - Sleep hygiene should be reviewed to assess factors that may improve sleep quality. - Weight management and regular exercise should be initiated or continued. - Recommend a download and sleep clinic evaluation after 4 weeks of therapy.   [Electronically signed] 10/15/2021 04:07 PM  Shelva Majestic MD, [facility], [place], American Board of Sleep Medicine  NPI: [id number]  [place] PH: [phone]   FX: [phone] [place]

## 2021-10-16 ENCOUNTER — Telehealth: Payer: Self-pay | Admitting: *Deleted

## 2021-10-16 NOTE — Telephone Encounter (Signed)
Patient notified via mychart

## 2021-10-16 NOTE — Telephone Encounter (Signed)
-----   Message from Troy Sine, MD sent at 10/15/2021  4:17 PM EDT ----- Mariann Laster, please notify pt and set up with DME for CPAP initiation.

## 2021-10-16 NOTE — Telephone Encounter (Signed)
CPAP orders sent to [facility] via EMR.

## 2022-03-27 ENCOUNTER — Telehealth: Payer: Self-pay | Admitting: Internal Medicine

## 2022-03-27 NOTE — Telephone Encounter (Signed)
Patient calling back to say that he is ready to go ahead and order the tank that he needs. Please advise

## 2022-03-28 NOTE — Progress Notes (Signed)
Cardiology Office Note:    Date:  03/29/2022   ID:  Ruben Parker, DOB [birth year], MRN [id number]  PCP:  Antony Contras, MD   [place] Providers Cardiologist:  Lenna Sciara, MD Referring MD: Antony Contras, MD   Chief Complaint/Reason for Referral: Palpitations  ASSESSMENT:    1. Palpitations   2. White coat syndrome without diagnosis of hypertension   3. Obstructive sleep apnea (adult) (pediatric)      PLAN:    In order of problems listed above: 1.  Palpitations: Reassuring evaluation thus far.  Monitor for now.  This is likely exacerbated by untreated sleep apnea. 2.  White coat hypertension: The patient tells me his blood pressures at home on his blood pressure monitor are well-controlled.  I have asked him to bring his blood pressure monitor in with him when he sees his PCP next time to make sure his readings agree with clinic readings. 3.  Snoring: Diagnosed with obstructive sleep apnea.  Will refer for treatment.             Dispo:  Return if symptoms worsen or fail to improve.      Medication Adjustments/Labs and Tests Ordered: Current medicines are reviewed at length with the patient today.  Concerns regarding medicines are outlined above.  The following changes have been made:    Labs/tests ordered: No orders of the defined types were placed in this encounter.   Medication Changes: No orders of the defined types were placed in this encounter.    Current medicines are reviewed at length with the patient today.  The patient does not have concerns regarding medicines.   History of Present Illness:    FOCUSED PROBLEM LIST:   1.  GERD 2.  Hyperlipidemia 3.  Obstructive sleep apnea 4.  Hypertension  7/23 consultation:  The patient is a 62 y.o. male with the indicated medical history here for palpitations.  Patient was seen by his primary care provider recently complaining of palpitations.  The patient tells me that he had developed palpitations  last week on 2 separate occasions.  They happen when he was laying down to go to sleep.  They lasted about 30 minutes.  They are not associate with chest pain or shortness of breath.  They also happen again yesterday.  They lasted for about 20 minutes as well.  He also found that his blood pressure was elevated.  He otherwise denies any exertional angina, exertional dyspnea, presyncope, syncope, peripheral edema, paroxysmal nocturnal dyspnea, orthopnea.  He has not required emergency room visits or hospitalizations.  He does work full-time and does snore.  He occasionally develops daytime somnolence.  He has never been tested for sleep apnea.  Plan: Echocardiogram, monitor, discontinue aspirin, and refer for sleep study.  Today: In the interim the patient had a monitor and echocardiogram that were both reassuring.  He was diagnosed with obstructive sleep apnea as well.  He was unsure about whether he wanted to go through with this however after several months he decided that due to increasing fatigue and palpitations he wanted to pursue treatment.  Unfortunately the referral however labs.  He is here to reestablish his need for CPAP.  He tells me that he is fatigued at times and continues to snore.  He still notices palpitations at the time.  Otherwise has had no cardiovascular complaints.        Current Medications: No outpatient medications have been marked as taking for the 03/29/22 encounter (Office Visit) with Ruben Parker,  Ruben Solomons, MD.     Allergies:    Patient has no known allergies.   Social History:   Social History   Tobacco Use   Smoking status: Never   Smokeless tobacco: Never  Substance Use Topics   Alcohol use: Yes    Alcohol/week: 2.0 - 3.0 standard drinks of alcohol    Types: 2 - 3 Glasses of wine per week    Comment: Daily glass of wine   Drug use: Not Currently     Family Hx: History reviewed. No pertinent family history.   Review of Systems:   Please see the history of  present illness.    All other systems reviewed and are negative.     EKGs/Labs/Other Test Reviewed:    EKG:  EKG performed today that I personally reviewed demonstrates normal sinus rhythm.  Prior CV studies:  TTE 2023:  1. Left ventricular ejection fraction, by estimation, is 60 to 65%. The  left ventricle has normal function. The left ventricle has no regional  wall motion abnormalities. Left ventricular diastolic parameters were  normal.   2. Right ventricular systolic function is normal. The right ventricular  size is normal. There is normal pulmonary artery systolic pressure.   3. The mitral valve is normal in structure. Mild mitral valve  regurgitation. No evidence of mitral stenosis.   4. The aortic valve is normal in structure. Aortic valve regurgitation is  not visualized. No aortic stenosis is present.   5. The inferior vena cava is normal in size with greater than 50%  respiratory variability, suggesting right atrial pressure of 3 mmHg.   Monitor 2023: Isolated SVEs were rare (<1.0%), SVE Couplets were rare (<1.0%), and no SVE Triplets were present. No Isolated VEs, VE Couplets, or VE Triplets were present.    No atrial fibrillation, ventricular tachyarrhythmias, or bradyarrhythmias were detected.   Patient triggered events corresponded with sinus rhythm.  Other studies Reviewed: Review of the additional studies/records demonstrates: None relevant  Recent Labs: No results found for requested labs within last 365 days.   Recent Lipid Panel No results found for: "CHOL", "TRIG", "HDL", "LDLCALC", "LDLDIRECT"  Risk Assessment/Calculations:           Physical Exam:    VS:  BP (!) 140/90   Pulse 66   Ht '5\' 4"'$  (1.626 m)   Wt 168 lb 12.8 oz (76.6 kg)   SpO2 97%   BMI 28.97 kg/m   HYPERTENSION CONTROL Vitals:   03/29/22 0849 03/29/22 0914  BP: (!) 161/89 (!) 140/90    The patient's blood pressure is elevated above target today.  In order to address the  patient's elevated BP: The blood pressure is usually elevated in clinic.  Blood pressures monitored at home have been optimal.        Wt Readings from Last 3 Encounters:  03/29/22 168 lb 12.8 oz (76.6 kg)  10/11/21 160 lb (72.6 kg)  08/28/21 165 lb (74.8 kg)    GENERAL:  No apparent distress, AOx3 HEENT:  No carotid bruits, +2 carotid impulses, no scleral icterus CAR: RRR no murmurs, gallops, rubs, or thrills RES:  Clear to auscultation bilaterally ABD:  Soft, nontender, nondistended, positive bowel sounds x 4 VASC:  +2 radial pulses, +2 carotid pulses, palpable pedal pulses NEURO:  CN 2-12 grossly intact; motor and sensory grossly intact PSYCH:  No active depression or anxiety EXT:  No edema, ecchymosis, or cyanosis  Signed, Early Osmond, MD  03/29/2022 9:35 AM  [facility] [place], [place], [place]  [address] Phone: [phone]; Fax: [phone]   Note:  This document was prepared using Dragon voice recognition software and may include unintentional dictation errors.

## 2022-03-29 ENCOUNTER — Ambulatory Visit: Payer: BC Managed Care – PPO | Attending: Internal Medicine | Admitting: Internal Medicine

## 2022-03-29 ENCOUNTER — Encounter: Payer: Self-pay | Admitting: Internal Medicine

## 2022-03-29 VITALS — BP 140/90 | HR 66 | Ht 64.0 in | Wt 168.8 lb

## 2022-03-29 DIAGNOSIS — G4733 Obstructive sleep apnea (adult) (pediatric): Secondary | ICD-10-CM

## 2022-03-29 DIAGNOSIS — R002 Palpitations: Secondary | ICD-10-CM

## 2022-03-29 DIAGNOSIS — R030 Elevated blood-pressure reading, without diagnosis of hypertension: Secondary | ICD-10-CM

## 2022-03-29 NOTE — Patient Instructions (Addendum)
Medication Instructions:  Your physician recommends that you continue on your current medications as directed. Please refer to the Current Medication list given to you today.  *If you need a refill on your cardiac medications before your next appointment, please call your pharmacy*   Follow-Up: At [facility], you and your health needs are our priority.  As part of our continuing mission to provide you with exceptional heart care, we have created designated Provider Care Teams.  These Care Teams include your primary Cardiologist (physician) and Advanced Practice Providers (APPs -  Physician Assistants and Nurse Practitioners) who all work together to provide you with the care you need, when you need it.  Your next appointment:   As needed  Provider:   Early Osmond, MD     Other Instructions Start CPAP

## 2022-04-03 ENCOUNTER — Telehealth: Payer: Self-pay | Admitting: *Deleted

## 2022-04-03 NOTE — Telephone Encounter (Signed)
Will reach out to [facility] to assist the patient.

## 2022-04-03 NOTE — Telephone Encounter (Signed)
-----   Message from Tor Netters, RN sent at 03/29/2022  9:20 AM EST ----- Regarding: CPAP Hi Gae Bon  This patient had a sleep study last year and got supplies but never followed up on getting the cpap machine. He saw Dr Ali Lowe today for follow and will just need to pick up his machine. Will you need to send another order for them to give it to him or is he good?    Thanks Bevely Palmer

## 2022-04-04 NOTE — Telephone Encounter (Signed)
Reached out to [place] and they will reinstate the patient for his cpap and call him after the insurance approval. Daryl at [facility] read in a note that the patient had declined to get his cpap last November.
